# Patient Record
Sex: Female | Born: 1955
Health system: Southern US, Community
[De-identification: ages and names within clinical notes are randomized; demographics above are authoritative.]

## PROBLEM LIST (undated history)

## (undated) DIAGNOSIS — E785 Hyperlipidemia, unspecified: Secondary | ICD-10-CM

## (undated) HISTORY — DX: Hyperlipidemia, unspecified: E78.5

---
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
## 1999-03-06 ENCOUNTER — Ambulatory Visit (HOSPITAL_COMMUNITY): Admission: RE | Admit: 1999-03-06 | Discharge: 1999-03-06 | Payer: Self-pay | Admitting: Obstetrics and Gynecology

## 1999-03-24 HISTORY — PX: TUBAL LIGATION: SHX77

## 2009-03-23 HISTORY — PX: HYSTEROTOMY: SHX1776

## 2010-02-18 ENCOUNTER — Encounter: Admission: RE | Admit: 2010-02-18 | Discharge: 2010-02-18 | Payer: Self-pay | Admitting: Gynecology

## 2010-02-27 ENCOUNTER — Ambulatory Visit
Admission: RE | Admit: 2010-02-27 | Discharge: 2010-02-27 | Payer: Self-pay | Source: Home / Self Care | Attending: Gynecology | Admitting: Gynecology

## 2010-04-14 LAB — HEMOGLOBIN A1C: Hgb A1c MFr Bld: 8.3 % — AB (ref 4.0–6.0)

## 2010-06-03 LAB — POCT I-STAT 4, (NA,K, GLUC, HGB,HCT)
Glucose, Bld: 161 mg/dL — ABNORMAL HIGH (ref 70–99)
HCT: 41 % (ref 36.0–46.0)
Hemoglobin: 13.9 g/dL (ref 12.0–15.0)
Potassium: 3.7 mEq/L (ref 3.5–5.1)
Sodium: 141 mEq/L (ref 135–145)

## 2010-08-08 NOTE — Op Note (Signed)
[facility]  Patient:    Laura Boone                      MRN: [id number] Proc. Date: 03/06/99 Adm. Date:  [id number] Attending:  Lendon Colonel                           Operative Report  PREOPERATIVE DIAGNOSIS:       Desires sterilization.  POSTOPERATIVE DIAGNOSIS:      Desires sterilization.  OPERATION:                    Laparoscopic tubal ligation.  SURGEON:                      Katherine Roan, M.D.  ANESTHESIA:  DESCRIPTION OF PROCEDURE:     The patient was placed in the lithotomy position,  prepped and draped in the usual fashion.  A transverse incision was made at the  umbilicus and the abdomen was distended with carbon dioxide using an aspiration  infusion technique.  A Hulka elevator was inserted into an anterior pointing uterus.  Visualization of the pelvis was accomplished through the umbilicus with a reuseable 10 trocar.  The scope was inserted and the ovaries and tubes were completely normal.  There was small sealing fibroids on the top of the uterus.  there was no evidence of endometriosis.  The tubes were then cauterized 2 cm lateral to the uterotubal junction ________.  There was some oozing underneath he left mesosalpinx and this was cauterized for hemostasis.  Wide luminal separation occurred.  Both ovaries were normal.  Gas was evacuated.  The skin was closed with Dexon.  The incisions were then infiltrated with 0.5% Marcaine with epinephrine. The second incision was a 5 mm incision that was performed under direct vision nce the laparoscope was in the abdomen. DD:  03/06/99 TD:  03/07/99 Job: [id number]

## 2010-08-21 ENCOUNTER — Encounter: Payer: 59 | Attending: Endocrinology | Admitting: *Deleted

## 2010-08-21 DIAGNOSIS — Z713 Dietary counseling and surveillance: Secondary | ICD-10-CM | POA: Insufficient documentation

## 2010-08-21 DIAGNOSIS — E669 Obesity, unspecified: Secondary | ICD-10-CM | POA: Insufficient documentation

## 2010-08-21 DIAGNOSIS — E119 Type 2 diabetes mellitus without complications: Secondary | ICD-10-CM | POA: Insufficient documentation

## 2010-09-30 ENCOUNTER — Encounter: Payer: Self-pay | Admitting: *Deleted

## 2010-09-30 ENCOUNTER — Ambulatory Visit: Payer: 59 | Admitting: *Deleted

## 2010-09-30 DIAGNOSIS — E119 Type 2 diabetes mellitus without complications: Secondary | ICD-10-CM

## 2011-02-11 ENCOUNTER — Other Ambulatory Visit: Payer: Self-pay | Admitting: Gynecology

## 2011-02-11 DIAGNOSIS — Z1231 Encounter for screening mammogram for malignant neoplasm of breast: Secondary | ICD-10-CM

## 2011-03-02 ENCOUNTER — Other Ambulatory Visit: Payer: Self-pay | Admitting: Gynecology

## 2011-03-04 ENCOUNTER — Ambulatory Visit
Admission: RE | Admit: 2011-03-04 | Discharge: 2011-03-04 | Disposition: A | Discharge status: Home / Self Care | Payer: 59 | Source: Ambulatory Visit | Attending: Gynecology | Admitting: Gynecology

## 2011-03-04 DIAGNOSIS — Z1231 Encounter for screening mammogram for malignant neoplasm of breast: Secondary | ICD-10-CM

## 2012-04-15 ENCOUNTER — Other Ambulatory Visit: Payer: Self-pay | Admitting: Gynecology

## 2012-04-15 DIAGNOSIS — Z1231 Encounter for screening mammogram for malignant neoplasm of breast: Secondary | ICD-10-CM

## 2012-05-02 ENCOUNTER — Ambulatory Visit
Admission: RE | Admit: 2012-05-02 | Discharge: 2012-05-02 | Disposition: A | Discharge status: Home / Self Care | Payer: 59 | Source: Ambulatory Visit | Attending: Gynecology | Admitting: Gynecology

## 2012-05-02 DIAGNOSIS — Z1231 Encounter for screening mammogram for malignant neoplasm of breast: Secondary | ICD-10-CM

## 2013-06-19 ENCOUNTER — Telehealth: Payer: Self-pay | Admitting: Family Medicine

## 2013-06-19 NOTE — Telephone Encounter (Signed)
Pt called and I told her Dr. Milinda Antisower advised she be treated today for the shingles.

## 2013-06-19 NOTE — Telephone Encounter (Signed)
I cannot unfortunately since I have so many people in line to get in- she does need to get her into and urgent care today (it cannot wait) for shingles as this should be seen immediately

## 2013-06-19 NOTE — Telephone Encounter (Signed)
Pt called and says her daughter, Laura Boone is your pt.  Nettie ElmSylvia would like to est w/you as her PCP, but since you're not taking new patients over 40, I told her you weren't accepting new patients.  She wants to know if you will make an exception since her daughter is your patient.  Nettie ElmSylvia also wants to be seen as quickly as possible b/c she thinks she may have shingles.  Can you make an exception and accommodate a new pt apptmt for her? Thank you.

## 2013-11-29 ENCOUNTER — Other Ambulatory Visit: Payer: Self-pay

## 2013-11-29 DIAGNOSIS — Z1231 Encounter for screening mammogram for malignant neoplasm of breast: Secondary | ICD-10-CM

## 2013-12-12 ENCOUNTER — Ambulatory Visit: Payer: 59

## 2013-12-18 ENCOUNTER — Ambulatory Visit
Admission: RE | Admit: 2013-12-18 | Discharge: 2013-12-18 | Disposition: A | Discharge status: Home / Self Care | Payer: 59 | Source: Ambulatory Visit

## 2013-12-18 DIAGNOSIS — Z1231 Encounter for screening mammogram for malignant neoplasm of breast: Secondary | ICD-10-CM

## 2015-06-24 ENCOUNTER — Other Ambulatory Visit: Payer: Self-pay

## 2015-06-24 DIAGNOSIS — Z1231 Encounter for screening mammogram for malignant neoplasm of breast: Secondary | ICD-10-CM

## 2015-07-10 ENCOUNTER — Ambulatory Visit
Admission: RE | Admit: 2015-07-10 | Discharge: 2015-07-10 | Disposition: A | Discharge status: Home / Self Care | Payer: 59 | Source: Ambulatory Visit

## 2015-07-10 DIAGNOSIS — Z1231 Encounter for screening mammogram for malignant neoplasm of breast: Secondary | ICD-10-CM

## 2016-05-21 HISTORY — PX: MOHS SURGERY: SUR867

## 2016-08-28 ENCOUNTER — Other Ambulatory Visit: Payer: Self-pay | Admitting: Gynecology

## 2016-08-28 DIAGNOSIS — Z1231 Encounter for screening mammogram for malignant neoplasm of breast: Secondary | ICD-10-CM

## 2016-08-31 ENCOUNTER — Ambulatory Visit
Admission: RE | Admit: 2016-08-31 | Discharge: 2016-08-31 | Disposition: A | Discharge status: Home / Self Care | Payer: 59 | Source: Ambulatory Visit | Attending: Gynecology | Admitting: Gynecology

## 2016-08-31 DIAGNOSIS — Z1231 Encounter for screening mammogram for malignant neoplasm of breast: Secondary | ICD-10-CM

## 2017-01-28 ENCOUNTER — Ambulatory Visit: Payer: 59 | Admitting: Family Medicine

## 2017-01-28 ENCOUNTER — Encounter: Payer: Self-pay | Admitting: Family Medicine

## 2017-01-28 DIAGNOSIS — Z87891 Personal history of nicotine dependence: Secondary | ICD-10-CM | POA: Insufficient documentation

## 2017-01-28 DIAGNOSIS — B009 Herpesviral infection, unspecified: Secondary | ICD-10-CM | POA: Insufficient documentation

## 2017-01-28 DIAGNOSIS — Z5320 Procedure and treatment not carried out because of patient's decision for unspecified reasons: Secondary | ICD-10-CM | POA: Insufficient documentation

## 2017-01-28 DIAGNOSIS — Z808 Family history of malignant neoplasm of other organs or systems: Secondary | ICD-10-CM | POA: Insufficient documentation

## 2017-01-28 DIAGNOSIS — M5412 Radiculopathy, cervical region: Secondary | ICD-10-CM

## 2017-01-28 DIAGNOSIS — R102 Pelvic and perineal pain: Secondary | ICD-10-CM | POA: Insufficient documentation

## 2017-01-28 DIAGNOSIS — E1169 Type 2 diabetes mellitus with other specified complication: Secondary | ICD-10-CM

## 2017-01-28 DIAGNOSIS — Z780 Asymptomatic menopausal state: Secondary | ICD-10-CM | POA: Insufficient documentation

## 2017-01-28 DIAGNOSIS — M545 Low back pain, unspecified: Secondary | ICD-10-CM | POA: Insufficient documentation

## 2017-01-28 DIAGNOSIS — E118 Type 2 diabetes mellitus with unspecified complications: Secondary | ICD-10-CM

## 2017-01-28 DIAGNOSIS — E1140 Type 2 diabetes mellitus with diabetic neuropathy, unspecified: Secondary | ICD-10-CM | POA: Insufficient documentation

## 2017-01-28 DIAGNOSIS — E785 Hyperlipidemia, unspecified: Secondary | ICD-10-CM

## 2017-01-28 DIAGNOSIS — F419 Anxiety disorder, unspecified: Secondary | ICD-10-CM | POA: Insufficient documentation

## 2017-01-28 NOTE — Patient Instructions (Addendum)
Please get me the lab work from Dr. Willeen CassBalan's office-since she recently had.  Please printed off of your my chart from them for all labs done within the past year.  Please try to drop this off within the next month.  Then if we need additional lab work we will contact you otherwise we will see you in 6 months.  If I do not get it within the next time I see you, please prepared to give us blood   -I will need to see you twice a year for chronic disease management of your diabetes, hyperlipidemia, weight management etc. and then 1 additional time per year for your yearly physical to ensure were doing your appropriate screening tests and immunizations etc.    Please realize, EXERCISE IS MEDICINE!  -  American Heart Association [facility]( AHA) guidelines for exercise : If you are in good health, without any medical conditions, you should engage in 150 minutes of moderate intensity aerobic activity per week.  This means you should be huffing and puffing throughout your workout.   Engaging in regular exercise will improve brain function and memory, as well as improve mood, boost immune system and help with weight management.  As well as the other, more well-known effects of exercise such as decreasing blood sugar levels, decreasing blood pressure,  and decreasing bad cholesterol levels/ increasing good cholesterol levels.     -  The AHA strongly endorses consumption of a diet that contains a variety of foods from all the food categories with an emphasis on fruits and vegetables; fat-free and low-fat dairy products; cereal and grain products; legumes and nuts; and fish, poultry, and/or extra lean meats.    Excessive food intake, especially of foods high in saturated and trans fats, sugar, and salt, should be avoided.    Adequate water intake of roughly 1/2 of your weight in pounds, should equal the ounces of water per day you should drink.  So for instance, if you're 200 pounds, that would be 100 ounces of water per  day.         Mediterranean Diet  Why follow it? Research shows. . Those who follow the Mediterranean diet have a reduced risk of heart disease  . The diet is associated with a reduced incidence of Parkinson's and Alzheimer's diseases . People following the diet may have longer life expectancies and lower rates of chronic diseases  . The Dietary Guidelines for Americans recommends the Mediterranean diet as an eating plan to promote health and prevent disease  What Is the Mediterranean Diet?  . Healthy eating plan based on typical foods and recipes of Mediterranean-style cooking . The diet is primarily a plant based diet; these foods should make up a majority of meals   Starches - Plant based foods should make up a majority of meals - They are an important sources of vitamins, minerals, energy, antioxidants, and fiber - Choose whole grains, foods high in fiber and minimally processed items  - Typical grain sources include wheat, oats, barley, corn, brown rice, bulgar, farro, millet, polenta, couscous  - Various types of beans include chickpeas, lentils, fava beans, black beans, white beans   Fruits  Veggies - Large quantities of antioxidant rich fruits & veggies; 6 or more servings  - Vegetables can be eaten raw or lightly drizzled with oil and cooked  - Vegetables common to the traditional Mediterranean Diet include: artichokes, arugula, beets, broccoli, brussel sprouts, cabbage, carrots, celery, collard greens, cucumbers, eggplant, kale, leeks, lemons,  lettuce, mushrooms, okra, onions, peas, peppers, potatoes, pumpkin, radishes, rutabaga, shallots, spinach, sweet potatoes, turnips, zucchini - Fruits common to the Mediterranean Diet include: apples, apricots, avocados, cherries, clementines, dates, figs, grapefruits, grapes, melons, nectarines, oranges, peaches, pears, pomegranates, strawberries, tangerines  Fats - Replace butter and margarine with healthy oils, such as olive oil, canola  oil, and tahini  - Limit nuts to no more than a handful a day  - Nuts include walnuts, almonds, pecans, pistachios, pine nuts  - Limit or avoid candied, honey roasted or heavily salted nuts - Olives are central to the [employer]iterranean diet - can be eaten whole or used in a variety of dishes   Meats Protein - Limiting red meat: no more than a few times a month - When eating red meat: choose lean cuts and keep the portion to the size of deck of cards - Eggs: approx. 0 to 4 times a week  - Fish and lean poultry: at least 2 a week  - Healthy protein sources include, chicken, [place]key, lean beef, lamb - Increase intake of seafood such as tuna, salmon, trout, mackerel, shrimp, scallops - Avoid or limit high fat processed meats such as sausage and bacon  Dairy - Include moderate amounts of low fat dairy products  - Focus on healthy dairy such as fat free yogurt, skim milk, low or reduced fat cheese - Limit dairy products higher in fat such as whole or 2% milk, cheese, ice cream  Alcohol - Moderate amounts of red wine is ok  - No more than 5 oz daily for women (all ages) and men older than age 59  - No more than 10 oz of wine daily for men younger than 6265  Other - Limit sweets and other desserts  - Use herbs and spices instead of salt to flavor foods  - Herbs and spices common to the traditional Mediterranean Diet include: basil, bay leaves, chives, cloves, cumin, fennel, garlic, lavender, marjoram, mint, oregano, parsley, pepper, rosemary, sage, savory, sumac, tarragon, thyme   It's not just a diet, it's a lifestyle:  . The Mediterranean diet includes lifestyle factors typical of those in the region  . Foods, drinks and meals are best eaten with others and savored . Daily physical activity is important for overall good health . This could be strenuous exercise like running and aerobics . This could also be more leisurely activities such as walking, housework, yard-work, or taking the  stairs . Moderation is the key; a balanced and healthy diet accommodates most foods and drinks . Consider portion sizes and frequency of consumption of certain foods   Meal Ideas & Options:  . Breakfast:  o Whole wheat toast or whole wheat English muffins with peanut butter & hard boiled egg o Steel cut oats topped with apples & cinnamon and skim milk  o Fresh fruit: banana, strawberries, melon, berries, peaches  o Smoothies: strawberries, bananas, greek yogurt, peanut butter o Low fat greek yogurt with blueberries and granola  o Egg white omelet with spinach and mushrooms o Breakfast couscous: whole wheat couscous, apricots, skim milk, cranberries  . Sandwiches:  o Hummus and grilled vegetables (peppers, zucchini, squash) on whole wheat bread   o Grilled chicken on whole wheat pita with lettuce, tomatoes, cucumbers or tzatziki  o Tuna salad on whole wheat bread: tuna salad made with greek yogurt, olives, red peppers, capers, green onions o Garlic rosemary lamb pita: lamb sauted with garlic, rosemary, salt & pepper; add lettuce, cucumber, greek yogurt  to pita - flavor with lemon juice and black pepper  . Seafood:  o Mediterranean grilled salmon, seasoned with garlic, basil, parsley, lemon juice and black pepper o Shrimp, lemon, and spinach whole-grain pasta salad made with low fat greek yogurt  o Seared scallops with lemon orzo  o Seared tuna steaks seasoned salt, pepper, coriander topped with tomato mixture of olives, tomatoes, olive oil, minced garlic, parsley, green onions and cappers  . Meats:  o Herbed greek chicken salad with kalamata olives, cucumber, feta  o Red bell peppers stuffed with spinach, bulgur, lean ground beef (or lentils) & topped with feta   o Kebabs: skewers of chicken, tomatoes, onions, zucchini, squash  o [place] burgers: made with red onions, mint, dill, lemon juice, feta cheese topped with roasted red peppers . Vegetarian o Cucumber salad: cucumbers, artichoke  hearts, celery, red onion, feta cheese, tossed in olive oil & lemon juice  o Hummus and whole grain pita points with a greek salad (lettuce, tomato, feta, olives, cucumbers, red onion) o Lentil soup with celery, carrots made with vegetable broth, garlic, salt and pepper  o Tabouli salad: parsley, bulgur, mint, scallions, cucumbers, tomato, radishes, lemon juice, olive oil, salt and pepper.

## 2017-01-28 NOTE — Progress Notes (Signed)
New patient office visit note:  Impression and Recommendations:    1. Hyperlipidemia associated with type 2 diabetes mellitus (HCC)   2. Cervical radiculopathy w h/o of diabetes mellitus (HCC)   3. History of smoking 30 or more pack years- quit 2010   4. Type 2 diabetes mellitus with complication, without long-term current use of insulin (HCC)   5. Colonoscopy refused   6. Family history of bone cancer- father age 59   7. Family history of brain cancer- mom died 2004 age 460's     There are no diagnoses linked to this encounter.  There are no diagnoses linked to this encounter.  No problem-specific Assessment & Plan notes found for this encounter.    Education and routine counseling performed. Handouts provided.  No orders of the defined types were placed in this encounter.   Gross side effects, risk and benefits, and alternatives of medications discussed with patient.  Patient is aware that all medications have potential side effects and we are unable to predict every side effect or drug-drug interaction that may occur.  Expresses verbal understanding and consents to current therapy plan and treatment regimen.  No Follow-up on file.  Please see AVS handed out to patient at the end of our visit for further patient instructions/ counseling done pertaining to today's office visit.    Note: This document was prepared using Dragon voice recognition software and may include unintentional dictation errors.  ----------------------------------------------------------------------------------------------------------------------    Subjective:    Chief complaint:   Chief Complaint  Patient presents with  . Establish Care     HPI: Laura Boone is a pleasant 61 y.o. female who [facility] to review their medical history with me and establish care.   I asked the patient to review their chronic problem list with me to ensure  everything was updated and accurate.    All recent office visits with other providers, any medical records that patient brought in etc  - I reviewed today.     Also asked pt to get me medical records from [facility] providers/ specialists that they had seen within the past 3-5 years- if they are in private practice and/or do not work for a [facility], [facility], [facility], [facility] or [employer] owned practice.  Told them to call their specialists to clarify this if they are not sure.   Never had a PCP.  Using GYN as PCP- referred to Laura Boone for Pre-DM.  daughter is a Secondary school teacherA-C at [employer], single-no sniffing and other, has worked at [employer] for 39 years currently as a Tax advisersales rep at [employer] is aware.  Patient has declined colonoscopy in the past.  Patient admits she is not the best at going to the doctors and wanting to get everything done that needs to be done.    Wt Readings from Last 3 Encounters:  01/28/17 207 lb (93.9 kg)  09/08/10 224 lb 1.6 oz (101.7 kg)   BP Readings from Last 3 Encounters:  01/28/17 106/72   Pulse Readings from Last 3 Encounters:  01/28/17 86   BMI Readings from Last 3 Encounters:  01/28/17 36.67 kg/m  09/08/10 38.47 kg/m    Patient Care Team    Relationship Specialty Notifications Start End  Laura Boone, Laura Onofrio, DO PCP - General Family Medicine  01/28/17   Laura FramesBalan, Bindubal, MD Consulting Physician Endocrinology  01/28/17   Boone, Laura SchneidersEvelyn M, NP Nurse Practitioner Gynecology  01/28/17  Boone, Laura Carol, MD Referring PhyElesa Hackersician Dermatology  01/28/17   Laura GantKendall, Laura S, MD Attending Physician Family Medicine  01/28/17    Comment: [facility] - R shoulder pain    Patient Active Problem List   Diagnosis Date Noted  . Hyperlipidemia associated with type 2 diabetes mellitus (HCC) 01/28/2017    Priority: High  . Type 2 diabetes mellitus (HCC)     Priority: High  . History of smoking 30 or more pack years- quit 2010 01/28/2017    Priority: Medium  . Acute low back pain  01/28/2017  . Anxiety 01/28/2017  . Herpes simplex 01/28/2017  . Menopause present 01/28/2017  . Pain in pelvis 01/28/2017  . Cervical radiculopathy w h/o of diabetes mellitus (HCC) 01/28/2017  . Colonoscopy refused 01/28/2017  . Family history of bone cancer- father age 59- 2009 01/28/2017  . Family history of brain cancer- mom died 2004 age 59's 01/28/2017     Past Medical History:  Diagnosis Date  . Diabetes mellitus   . Hyperlipidemia      Past Medical History:  Diagnosis Date  . Diabetes mellitus   . Hyperlipidemia      Past Surgical History:  Procedure Laterality Date  . HYSTEROTOMY  2011  . MOHS SURGERY  05/2016  . TONSILLECTOMY  1965  . TUBAL LIGATION  2001     Family History  Problem Relation Age of Onset  . Breast cancer Maternal Aunt 53  . Brain cancer Mother   . Hyperlipidemia Father   . Bone cancer Father      Social History   Substance and Sexual Activity  Drug Use No     Social History   Substance and Sexual Activity  Alcohol Use Yes   Comment: occ.     Social History   Tobacco Use  Smoking Status Former Smoker  . Types: Cigarettes  . Last attempt to quit: 08/30/2008  . Years since quitting: 8.4  Smokeless Tobacco Never Used     Outpatient Encounter Medications as of 01/28/2017  Medication Sig Note  . gabapentin (NEURONTIN) 300 MG capsule Take 300 mg 2 (two) times daily by mouth. Patient takes 1-2 daily as needed   . INVOKAMET XR 50-1000 MG TB24 Take 1 tablet daily by mouth.   . rosuvastatin (CRESTOR) 20 MG tablet Take 1 tablet daily by mouth.   Marland Kitchen. HYDROcodone-acetaminophen (NORCO) 7.5-325 MG tablet TAKE 1 TABLET BY MOUTH EVERY 6-8 HOURS AS NEEDED FOR PAIN 01/28/2017: Patient was taking for neck pain - has not taken in 3 weeks   . methocarbamol (ROBAXIN) 750 MG tablet TAKE 1 TABLET BY MOUTH THREE TIMES A DAY AS NEEDED FOR MUSCLE SPASM 01/28/2017: Patient was taking for neck pain - has not taken in 2-3 weeks  . predniSONE  (STERAPRED UNI-PAK 21 TAB) 10 MG (21) TBPK tablet  01/28/2017: Patient was taking for neck pain finished 1.5 weeks ago   No facility-administered encounter medications on file as of 01/28/2017.     Allergies: Patient has no active allergies.   ROS   Objective:   Blood pressure 106/72, pulse 86, height 5\' 3"  (1.6 m), weight 207 lb (93.9 kg). Body mass index is 36.67 kg/m. General: Well Developed, well nourished, and in no acute distress.  Neuro: Alert and oriented x3, extra-ocular muscles intact, sensation grossly intact.  HEENT:[place]/AT, PERRLA, neck supple, No carotid bruits Skin: no gross rashes  Cardiac: Regular rate and rhythm Respiratory: Essentially clear to auscultation bilaterally. Not using accessory muscles, speaking in  full sentences.  Abdominal: not grossly distended Musculoskeletal: Ambulates w/o diff, FROM * 4 ext.  Vasc: less 2 sec cap RF, warm and pink  Psych:  No HI/SI, judgement and insight good, Euthymic mood. Full Affect.    No results found for this or any previous visit (from the past 2160 hour(s)).

## 2017-02-09 ENCOUNTER — Ambulatory Visit (INDEPENDENT_AMBULATORY_CARE_PROVIDER_SITE_OTHER): Payer: 59 | Admitting: Sports Medicine

## 2017-02-09 ENCOUNTER — Encounter: Payer: Self-pay | Admitting: Sports Medicine

## 2017-02-09 VITALS — BP 100/64 | HR 93 | Resp 16

## 2017-02-09 DIAGNOSIS — L608 Other nail disorders: Secondary | ICD-10-CM

## 2017-02-09 DIAGNOSIS — L603 Nail dystrophy: Secondary | ICD-10-CM

## 2017-02-09 NOTE — Progress Notes (Signed)
Subjective: Laura SprinklesSylvia Boone is a 61 y.o. female patient seen today in office with complaint of sometimes painful thickened and discolored nails especially left 1st toenail. Patient is desiring treatment for nail changes; has tried self trimming with no improvement. Reports that nails are becoming difficult to manage because of the thickness and does not like how the left 1st toenail grows in on the sides; Admits previous PNA and Lamisil PO in the past. Patient has no other pedal complaints at this time.   Patient Active Problem List   Diagnosis Date Noted  . Acute low back pain 01/28/2017  . Anxiety 01/28/2017  . Herpes simplex 01/28/2017  . Menopause present 01/28/2017  . Pain in pelvis 01/28/2017  . Hyperlipidemia associated with type 2 diabetes mellitus (HCC) 01/28/2017  . Cervical radiculopathy w h/o of diabetes mellitus (HCC) 01/28/2017  . History of smoking 30 or more pack years- quit 2010 01/28/2017  . Colonoscopy refused 01/28/2017  . Family history of bone cancer- father age 59- 2009 01/28/2017  . Family history of brain cancer- mom died 2004 age 59's 01/28/2017  . Type 2 diabetes mellitus (HCC)     Current Outpatient Medications on File Prior to Visit  Medication Sig Dispense Refill  . gabapentin (NEURONTIN) 300 MG capsule Take 300 mg daily by mouth. Patient takes 1-2 daily as needed  2  . HYDROcodone-acetaminophen (NORCO) 7.5-325 MG tablet TAKE 1 TABLET BY MOUTH EVERY 6-8 HOURS AS NEEDED FOR PAIN  0  . INVOKAMET XR 50-1000 MG TB24 Take 1 tablet daily by mouth.    . methocarbamol (ROBAXIN) 750 MG tablet TAKE 1 TABLET BY MOUTH THREE TIMES A DAY AS NEEDED FOR MUSCLE SPASM  0  . predniSONE (STERAPRED UNI-PAK 21 TAB) 10 MG (21) TBPK tablet     . rosuvastatin (CRESTOR) 20 MG tablet Take 1 tablet daily by mouth.     No current facility-administered medications on file prior to visit.     No Known Allergies  Objective: Physical Exam  General: Well developed, nourished, no acute  distress, awake, alert and oriented x 3  Vascular: Dorsalis pedis artery 2/4 bilateral, Posterior tibial artery 1/4 bilateral, skin temperature warm to warm proximal to distal bilateral lower extremities, no varicosities, pedal hair present bilateral.  Neurological: Gross sensation present via light touch bilateral.   Dermatological: Skin is warm, dry, and supple bilateral, Nails 1-10 are tender, short thick, and discolored with mild subungal debris with L>R 1st toenail most involved, no webspace macerations present bilateral, no open lesions present bilateral, no callus/corns/hyperkeratotic tissue present bilateral. No signs of infection bilateral.  Musculoskeletal: No symptomatic boney deformities noted bilateral. Muscular strength within normal limits without painon range of motion. No pain with calf compression bilateral.  Assessment and Plan:  Problem List Items Addressed This Visit    None    Visit Diagnoses    Nail dystrophy    -  Primary   Pincer nail deformity          -Examined patient -Discussed treatment options for painful dystrophic nails  -Complimentarily trimmed nails for patient using sterile nail nipper without incident, Since patient does not want nail procedure recommend topical tea tree oil and filing nail -If nails show no improvement then recommend fungal culture and laser  -Patient to return in 10 weeks for follow up evaluation and discussion of nails or sooner if symptoms worsen.  Asencion Islamitorya Anasia Agro, DPM

## 2017-02-09 NOTE — Patient Instructions (Addendum)
Tea tree oil to nails daily after shower and filing them down

## 2017-02-09 NOTE — Progress Notes (Signed)
   Subjective:    Patient ID: Laura SprinklesSylvia Boone, female    DOB: November 10, 1955, 61 y.o.   MRN: [id number]  HPI    Review of Systems  All other systems reviewed and are negative.      Objective:   Physical Exam        Assessment & Plan:

## 2017-03-22 ENCOUNTER — Other Ambulatory Visit: Payer: Self-pay | Admitting: Endocrinology

## 2017-03-22 DIAGNOSIS — R9389 Abnormal findings on diagnostic imaging of other specified body structures: Secondary | ICD-10-CM

## 2017-03-27 DIAGNOSIS — M898X1 Other specified disorders of bone, shoulder: Secondary | ICD-10-CM | POA: Insufficient documentation

## 2017-03-27 DIAGNOSIS — M79601 Pain in right arm: Secondary | ICD-10-CM | POA: Insufficient documentation

## 2017-03-29 ENCOUNTER — Ambulatory Visit
Admission: RE | Admit: 2017-03-29 | Discharge: 2017-03-29 | Disposition: A | Discharge status: Home / Self Care | Payer: 59 | Source: Ambulatory Visit | Attending: Endocrinology | Admitting: Endocrinology

## 2017-03-29 DIAGNOSIS — R9389 Abnormal findings on diagnostic imaging of other specified body structures: Secondary | ICD-10-CM

## 2017-03-30 ENCOUNTER — Other Ambulatory Visit: Payer: Self-pay | Admitting: Endocrinology

## 2017-03-30 DIAGNOSIS — E049 Nontoxic goiter, unspecified: Secondary | ICD-10-CM

## 2017-04-20 ENCOUNTER — Ambulatory Visit (INDEPENDENT_AMBULATORY_CARE_PROVIDER_SITE_OTHER): Payer: 59 | Admitting: Sports Medicine

## 2017-04-20 ENCOUNTER — Encounter: Payer: Self-pay | Admitting: Sports Medicine

## 2017-04-20 DIAGNOSIS — M79674 Pain in right toe(s): Secondary | ICD-10-CM | POA: Diagnosis not present

## 2017-04-20 DIAGNOSIS — L608 Other nail disorders: Secondary | ICD-10-CM

## 2017-04-20 DIAGNOSIS — L603 Nail dystrophy: Secondary | ICD-10-CM

## 2017-04-20 DIAGNOSIS — M79675 Pain in left toe(s): Secondary | ICD-10-CM | POA: Diagnosis not present

## 2017-04-20 NOTE — Progress Notes (Signed)
Subjective: Lamar SprinklesSylvia Boone is a 62 y.o. female patient seen today in office for nail check. Reports that she has been doing the tea tree oil with some improvement and hasn't had any issues with pain or ingrown yet on left. Patient is also here to discuss fungal culture vs laser for nails. Denies nausea/vomiting/fever/chills/swelling/pain acutely in toes. Patient has no other pedal complaints at this time.   Patient Active Problem List   Diagnosis Date Noted  . Acute low back pain 01/28/2017  . Anxiety 01/28/2017  . Herpes simplex 01/28/2017  . Menopause present 01/28/2017  . Pain in pelvis 01/28/2017  . Hyperlipidemia associated with type 2 diabetes mellitus (HCC) 01/28/2017  . Cervical radiculopathy w h/o of diabetes mellitus (HCC) 01/28/2017  . History of smoking 30 or more pack years- quit 2010 01/28/2017  . Colonoscopy refused 01/28/2017  . Family history of bone cancer- father age 170- 2009 01/28/2017  . Family history of brain cancer- mom died 2004 age 60's 01/28/2017  . Type 2 diabetes mellitus (HCC)     Current Outpatient Medications on File Prior to Visit  Medication Sig Dispense Refill  . glimepiride (AMARYL) 1 MG tablet TAKE 1 TABLET WITH BREAKFAST OR THE FIRST MAIN MEAL OF THE DAY    . INVOKAMET XR 50-1000 MG TB24 Take 1 tablet daily by mouth.    . metroNIDAZOLE (METROGEL) 1 % gel metronidazole 1 % topical gel    . rosuvastatin (CRESTOR) 20 MG tablet Take 1 tablet daily by mouth.     No current facility-administered medications on file prior to visit.     No Known Allergies  Objective: Physical Exam  General: Well developed, nourished, no acute distress, awake, alert and oriented x 3  Vascular: Dorsalis pedis artery 2/4 bilateral, Posterior tibial artery 1/4 bilateral, skin temperature warm to warm proximal to distal bilateral lower extremities, no varicosities, pedal hair present bilateral.  Neurological: Gross sensation present via light touch bilateral.    Dermatological: Skin is warm, dry, and supple bilateral, Nails 1-10 are tender, short thick, and discolored with mild subungal debris with L>R 1st toenail most involved with pince deformity, no webspace macerations present bilateral, no open lesions present bilateral, no callus/corns/hyperkeratotic tissue present bilateral. No signs of infection bilateral.  Musculoskeletal: No symptomatic boney deformities noted bilateral. Muscular strength within normal limits without painon range of motion. No pain with calf compression bilateral.  Assessment and Plan:  Problem List Items Addressed This Visit    None    Visit Diagnoses    Nail dystrophy    -  Primary   Pincer nail deformity       Toe pain, bilateral       L>R      -Examined patient -Re-Discussed treatment options for painful dystrophic nails  -Complimentarily trimmed nails for patient using sterile nail nipper without incident -Since patient does not want nail procedure recommend continue with topical tea tree oil and filing nail -We will continue to monitor nails and consider adding on laser or doing a fungal culture if nails fail to continue to improve -Patient to return in 10 weeks for follow up evaluation or sooner if symptoms worsen.  Asencion Islamitorya Bret Stamour, DPM

## 2017-05-11 DIAGNOSIS — J3489 Other specified disorders of nose and nasal sinuses: Secondary | ICD-10-CM | POA: Insufficient documentation

## 2017-05-11 DIAGNOSIS — J342 Deviated nasal septum: Secondary | ICD-10-CM | POA: Insufficient documentation

## 2017-07-06 ENCOUNTER — Ambulatory Visit: Payer: 59 | Admitting: Sports Medicine

## 2017-07-15 DIAGNOSIS — M5020 Other cervical disc displacement, unspecified cervical region: Secondary | ICD-10-CM | POA: Insufficient documentation

## 2017-07-29 ENCOUNTER — Encounter: Payer: 59 | Admitting: Family Medicine

## 2017-08-05 ENCOUNTER — Encounter: Payer: 59 | Admitting: Family Medicine

## 2017-08-05 ENCOUNTER — Ambulatory Visit: Payer: 59 | Admitting: Family Medicine

## 2017-08-11 DIAGNOSIS — M7541 Impingement syndrome of right shoulder: Secondary | ICD-10-CM | POA: Insufficient documentation

## 2017-09-26 DIAGNOSIS — N951 Menopausal and female climacteric states: Secondary | ICD-10-CM | POA: Insufficient documentation

## 2017-09-26 DIAGNOSIS — E1165 Type 2 diabetes mellitus with hyperglycemia: Secondary | ICD-10-CM | POA: Insufficient documentation

## 2018-01-04 ENCOUNTER — Other Ambulatory Visit: Payer: Self-pay | Admitting: Gynecology

## 2018-01-04 DIAGNOSIS — Z1231 Encounter for screening mammogram for malignant neoplasm of breast: Secondary | ICD-10-CM

## 2018-01-13 ENCOUNTER — Ambulatory Visit
Admission: RE | Admit: 2018-01-13 | Discharge: 2018-01-13 | Disposition: A | Discharge status: Home / Self Care | Payer: 59 | Source: Ambulatory Visit | Attending: Gynecology | Admitting: Gynecology

## 2018-01-13 DIAGNOSIS — Z1231 Encounter for screening mammogram for malignant neoplasm of breast: Secondary | ICD-10-CM

## 2018-03-24 DIAGNOSIS — M25511 Pain in right shoulder: Secondary | ICD-10-CM | POA: Diagnosis not present

## 2018-03-28 ENCOUNTER — Other Ambulatory Visit: Payer: Self-pay | Admitting: Endocrinology

## 2018-03-28 DIAGNOSIS — E041 Nontoxic single thyroid nodule: Secondary | ICD-10-CM

## 2018-03-29 DIAGNOSIS — M25511 Pain in right shoulder: Secondary | ICD-10-CM | POA: Diagnosis not present

## 2018-03-31 DIAGNOSIS — M79601 Pain in right arm: Secondary | ICD-10-CM | POA: Diagnosis not present

## 2018-04-07 DIAGNOSIS — M25511 Pain in right shoulder: Secondary | ICD-10-CM | POA: Diagnosis not present

## 2018-04-08 ENCOUNTER — Ambulatory Visit
Admission: RE | Admit: 2018-04-08 | Discharge: 2018-04-08 | Disposition: A | Discharge status: Home / Self Care | Payer: BLUE CROSS/BLUE SHIELD | Source: Ambulatory Visit | Attending: Endocrinology | Admitting: Endocrinology

## 2018-04-08 DIAGNOSIS — E041 Nontoxic single thyroid nodule: Secondary | ICD-10-CM

## 2018-04-08 DIAGNOSIS — E042 Nontoxic multinodular goiter: Secondary | ICD-10-CM | POA: Diagnosis not present

## 2018-04-14 DIAGNOSIS — M25511 Pain in right shoulder: Secondary | ICD-10-CM | POA: Diagnosis not present

## 2018-04-19 DIAGNOSIS — M25511 Pain in right shoulder: Secondary | ICD-10-CM | POA: Diagnosis not present

## 2018-05-05 DIAGNOSIS — M25511 Pain in right shoulder: Secondary | ICD-10-CM | POA: Diagnosis not present

## 2018-08-10 DIAGNOSIS — Z5189 Encounter for other specified aftercare: Secondary | ICD-10-CM | POA: Diagnosis not present

## 2018-08-30 DIAGNOSIS — E7800 Pure hypercholesterolemia, unspecified: Secondary | ICD-10-CM | POA: Diagnosis not present

## 2018-08-30 DIAGNOSIS — E041 Nontoxic single thyroid nodule: Secondary | ICD-10-CM | POA: Diagnosis not present

## 2018-08-30 DIAGNOSIS — E1165 Type 2 diabetes mellitus with hyperglycemia: Secondary | ICD-10-CM | POA: Diagnosis not present

## 2018-09-06 DIAGNOSIS — E041 Nontoxic single thyroid nodule: Secondary | ICD-10-CM | POA: Diagnosis not present

## 2018-09-06 DIAGNOSIS — E1165 Type 2 diabetes mellitus with hyperglycemia: Secondary | ICD-10-CM | POA: Diagnosis not present

## 2018-09-06 DIAGNOSIS — E7800 Pure hypercholesterolemia, unspecified: Secondary | ICD-10-CM | POA: Diagnosis not present

## 2018-09-13 DIAGNOSIS — Z23 Encounter for immunization: Secondary | ICD-10-CM | POA: Diagnosis not present

## 2019-02-28 DIAGNOSIS — E7800 Pure hypercholesterolemia, unspecified: Secondary | ICD-10-CM | POA: Diagnosis not present

## 2019-02-28 DIAGNOSIS — E1165 Type 2 diabetes mellitus with hyperglycemia: Secondary | ICD-10-CM | POA: Diagnosis not present

## 2019-02-28 DIAGNOSIS — E041 Nontoxic single thyroid nodule: Secondary | ICD-10-CM | POA: Diagnosis not present

## 2019-03-07 DIAGNOSIS — E1165 Type 2 diabetes mellitus with hyperglycemia: Secondary | ICD-10-CM | POA: Diagnosis not present

## 2019-03-07 DIAGNOSIS — E7800 Pure hypercholesterolemia, unspecified: Secondary | ICD-10-CM | POA: Diagnosis not present

## 2019-03-07 DIAGNOSIS — E041 Nontoxic single thyroid nodule: Secondary | ICD-10-CM | POA: Diagnosis not present

## 2019-06-30 ENCOUNTER — Other Ambulatory Visit: Payer: Self-pay

## 2019-06-30 ENCOUNTER — Ambulatory Visit: Payer: 59 | Admitting: Podiatry

## 2019-06-30 DIAGNOSIS — L03032 Cellulitis of left toe: Secondary | ICD-10-CM | POA: Diagnosis not present

## 2019-06-30 NOTE — Patient Instructions (Signed)

## 2019-07-01 MED ORDER — DOXYCYCLINE HYCLATE 100 MG PO TABS: 100.0000 mg | ORAL_TABLET | Freq: Two times a day (BID) | ORAL | 0 refills | Status: AC

## 2019-07-03 ENCOUNTER — Telehealth: Payer: Self-pay | Admitting: Podiatry

## 2019-07-03 NOTE — Telephone Encounter (Signed)
Patient thought you were going to call in an different type of antibiotic for her, not the pill. Since it wasent at the Pharmacy on Friday, on call Doctor called her in a pill, and that's not what you guys had talked about. Patient is wondering if she should go ahead and take the pill.

## 2019-07-04 MED ORDER — NEOMYCIN-POLYMYXIN-HC 3.5-10000-1 OT SUSP: OTIC | 0 refills | Status: AC

## 2019-07-04 NOTE — Telephone Encounter (Signed)
Sent cortisporin drops

## 2019-07-04 NOTE — Addendum Note (Signed)
Addended by: Ventura Sellers on: 07/04/2019 08:41 AM   Modules accepted: Orders

## 2019-07-05 NOTE — Telephone Encounter (Signed)
Spoke to patient and advised.

## 2019-07-14 ENCOUNTER — Other Ambulatory Visit: Payer: Self-pay

## 2019-07-14 ENCOUNTER — Ambulatory Visit (INDEPENDENT_AMBULATORY_CARE_PROVIDER_SITE_OTHER): Payer: BC Managed Care – PPO | Admitting: Podiatry

## 2019-07-14 DIAGNOSIS — L600 Ingrowing nail: Secondary | ICD-10-CM

## 2019-07-14 DIAGNOSIS — M79676 Pain in unspecified toe(s): Secondary | ICD-10-CM

## 2019-07-14 NOTE — Progress Notes (Signed)
  Subjective:  Patient ID: Laura Boone, female    DOB: 07-03-1955,  MRN: [id number]  Chief Complaint  Patient presents with  . Nail Problem    Pt states healing well no concerns.    65 y.o. female presents for follow up of nail procedure. History confirmed with patient.   Objective:  Physical Exam: Ingrown nail avulsion site: overlying soft crust, no warmth, no drainage and no erythema Assessment:   1. Ingrown nail   2. Pain around toenail      Plan:  Patient was evaluated and treated and all questions answered.  S/p Ingrown Toenail Excision, left -Healing well without issue. -Discussed return precautions. -F/u PRN

## 2019-08-17 ENCOUNTER — Other Ambulatory Visit: Payer: Self-pay | Admitting: Gynecology

## 2019-08-17 DIAGNOSIS — Z1231 Encounter for screening mammogram for malignant neoplasm of breast: Secondary | ICD-10-CM

## 2019-08-31 DIAGNOSIS — E041 Nontoxic single thyroid nodule: Secondary | ICD-10-CM | POA: Diagnosis not present

## 2019-08-31 DIAGNOSIS — E7800 Pure hypercholesterolemia, unspecified: Secondary | ICD-10-CM | POA: Diagnosis not present

## 2019-08-31 DIAGNOSIS — E1165 Type 2 diabetes mellitus with hyperglycemia: Secondary | ICD-10-CM | POA: Diagnosis not present

## 2019-09-05 ENCOUNTER — Other Ambulatory Visit: Payer: Self-pay | Admitting: Endocrinology

## 2019-09-05 ENCOUNTER — Ambulatory Visit: Payer: BLUE CROSS/BLUE SHIELD

## 2019-09-05 DIAGNOSIS — E7800 Pure hypercholesterolemia, unspecified: Secondary | ICD-10-CM | POA: Diagnosis not present

## 2019-09-05 DIAGNOSIS — E1165 Type 2 diabetes mellitus with hyperglycemia: Secondary | ICD-10-CM | POA: Diagnosis not present

## 2019-09-05 DIAGNOSIS — E041 Nontoxic single thyroid nodule: Secondary | ICD-10-CM

## 2019-09-12 ENCOUNTER — Other Ambulatory Visit: Payer: Self-pay

## 2019-09-12 ENCOUNTER — Ambulatory Visit
Admission: RE | Admit: 2019-09-12 | Discharge: 2019-09-12 | Disposition: A | Discharge status: Home / Self Care | Payer: BLUE CROSS/BLUE SHIELD | Source: Ambulatory Visit | Attending: Gynecology | Admitting: Gynecology

## 2019-09-12 DIAGNOSIS — Z1231 Encounter for screening mammogram for malignant neoplasm of breast: Secondary | ICD-10-CM

## 2019-09-15 ENCOUNTER — Other Ambulatory Visit: Payer: Self-pay | Admitting: Gynecology

## 2019-09-15 DIAGNOSIS — R928 Other abnormal and inconclusive findings on diagnostic imaging of breast: Secondary | ICD-10-CM

## 2019-09-23 NOTE — Progress Notes (Signed)
  Subjective:  Patient ID: Laura Boone, female    DOB: 09-29-1955,  MRN: [id number]  No chief complaint on file.   64 y.o. female presents with the above complaint. Reports infected ingrown nail to the left great toe with warmth erythema and pain with pus drainage Objective:  Physical Exam: warm, good capillary refill, no trophic changes or ulcerative lesions, normal DP and PT pulses and normal sensory exam.  Painful ingrowing nail at left great toe local warmth noted, local erythema noted and purulent drainage noted  Assessment:   1. Paronychia of great toe, left      Plan:  Patient was evaluated and treated and all questions answered.  Paronychia, left -Patient elects to proceed with ingrown toenail removal today -Ingrown nail excised. See procedure note. -Educated on post-procedure care including soaking. Written instructions provided. -Rx doxcycline  Procedure: Incision and drainage of paronychia Location: Left 1st toe  Anesthesia: Lidocaine 1% plain; 1.52mL and Marcaine 0.5% plain; 1.46mL, digital block. Skin Prep: Alcohol. Dressing: Silvadene; telfa; dry, sterile, compression dressing. Technique: Following skin prep, the toe was exsanguinated and a tourniquet was secured at the base of the toe. The affected nail border was freed, split with a nail splitter, and excised.  The lateral border was incised with a 15 blade to evacuate purulent drainage.  The area was irrigated. The tourniquet was then removed and sterile dressing applied. Disposition: Patient tolerated procedure well. Patient to return in 2 weeks for follow-up.   Return in about 2 weeks (around 07/14/2019) for Nail Check.   MDM

## 2019-09-27 ENCOUNTER — Other Ambulatory Visit: Payer: Self-pay | Admitting: Gynecology

## 2019-09-27 ENCOUNTER — Other Ambulatory Visit: Payer: Self-pay

## 2019-09-27 ENCOUNTER — Ambulatory Visit
Admission: RE | Admit: 2019-09-27 | Discharge: 2019-09-27 | Disposition: A | Discharge status: Home / Self Care | Payer: BC Managed Care – PPO | Source: Ambulatory Visit | Attending: Gynecology | Admitting: Gynecology

## 2019-09-27 DIAGNOSIS — S2002XA Contusion of left breast, initial encounter: Secondary | ICD-10-CM | POA: Diagnosis not present

## 2019-09-27 DIAGNOSIS — N6489 Other specified disorders of breast: Secondary | ICD-10-CM | POA: Diagnosis not present

## 2019-09-27 DIAGNOSIS — N641 Fat necrosis of breast: Secondary | ICD-10-CM

## 2019-09-27 DIAGNOSIS — R928 Other abnormal and inconclusive findings on diagnostic imaging of breast: Secondary | ICD-10-CM | POA: Diagnosis not present

## 2019-09-28 DIAGNOSIS — Z130 Encounter for screening for diseases of the blood and blood-forming organs and certain disorders involving the immune mechanism: Secondary | ICD-10-CM | POA: Diagnosis not present

## 2019-09-28 DIAGNOSIS — Z780 Asymptomatic menopausal state: Secondary | ICD-10-CM | POA: Diagnosis not present

## 2019-09-28 DIAGNOSIS — Z124 Encounter for screening for malignant neoplasm of cervix: Secondary | ICD-10-CM | POA: Diagnosis not present

## 2019-09-28 DIAGNOSIS — Z6836 Body mass index (BMI) 36.0-36.9, adult: Secondary | ICD-10-CM | POA: Diagnosis not present

## 2019-09-28 DIAGNOSIS — Z01419 Encounter for gynecological examination (general) (routine) without abnormal findings: Secondary | ICD-10-CM | POA: Diagnosis not present

## 2019-09-29 DIAGNOSIS — Z1151 Encounter for screening for human papillomavirus (HPV): Secondary | ICD-10-CM | POA: Diagnosis not present

## 2019-09-29 DIAGNOSIS — Z124 Encounter for screening for malignant neoplasm of cervix: Secondary | ICD-10-CM | POA: Diagnosis not present

## 2019-10-31 DIAGNOSIS — L308 Other specified dermatitis: Secondary | ICD-10-CM | POA: Diagnosis not present

## 2019-10-31 DIAGNOSIS — B0089 Other herpesviral infection: Secondary | ICD-10-CM | POA: Diagnosis not present

## 2019-10-31 DIAGNOSIS — Z85828 Personal history of other malignant neoplasm of skin: Secondary | ICD-10-CM | POA: Diagnosis not present

## 2019-10-31 DIAGNOSIS — L720 Epidermal cyst: Secondary | ICD-10-CM | POA: Diagnosis not present

## 2020-03-28 ENCOUNTER — Ambulatory Visit
Admission: RE | Admit: 2020-03-28 | Discharge: 2020-03-28 | Disposition: A | Discharge status: Home / Self Care | Payer: BC Managed Care – PPO | Source: Ambulatory Visit | Attending: Endocrinology | Admitting: Endocrinology

## 2020-03-28 DIAGNOSIS — E041 Nontoxic single thyroid nodule: Secondary | ICD-10-CM

## 2020-04-01 ENCOUNTER — Other Ambulatory Visit: Payer: Self-pay

## 2020-04-01 ENCOUNTER — Other Ambulatory Visit: Payer: Self-pay | Admitting: Gynecology

## 2020-04-01 ENCOUNTER — Ambulatory Visit
Admission: RE | Admit: 2020-04-01 | Discharge: 2020-04-01 | Disposition: A | Discharge status: Home / Self Care | Payer: BC Managed Care – PPO | Source: Ambulatory Visit | Attending: Gynecology | Admitting: Gynecology

## 2020-04-01 DIAGNOSIS — N641 Fat necrosis of breast: Secondary | ICD-10-CM | POA: Diagnosis not present

## 2020-04-16 DIAGNOSIS — Z1331 Encounter for screening for depression: Secondary | ICD-10-CM | POA: Diagnosis not present

## 2020-04-16 DIAGNOSIS — E1169 Type 2 diabetes mellitus with other specified complication: Secondary | ICD-10-CM | POA: Diagnosis not present

## 2020-04-25 DIAGNOSIS — E1165 Type 2 diabetes mellitus with hyperglycemia: Secondary | ICD-10-CM | POA: Diagnosis not present

## 2020-04-25 DIAGNOSIS — E7800 Pure hypercholesterolemia, unspecified: Secondary | ICD-10-CM | POA: Diagnosis not present

## 2020-04-25 DIAGNOSIS — E041 Nontoxic single thyroid nodule: Secondary | ICD-10-CM | POA: Diagnosis not present

## 2020-04-25 DIAGNOSIS — A000 Cholera due to Vibrio cholerae 01, biovar cholerae: Secondary | ICD-10-CM | POA: Diagnosis not present

## 2020-05-02 DIAGNOSIS — E041 Nontoxic single thyroid nodule: Secondary | ICD-10-CM | POA: Diagnosis not present

## 2020-05-02 DIAGNOSIS — E7800 Pure hypercholesterolemia, unspecified: Secondary | ICD-10-CM | POA: Diagnosis not present

## 2020-05-02 DIAGNOSIS — E1165 Type 2 diabetes mellitus with hyperglycemia: Secondary | ICD-10-CM | POA: Diagnosis not present

## 2020-07-29 DIAGNOSIS — S6991XA Unspecified injury of right wrist, hand and finger(s), initial encounter: Secondary | ICD-10-CM | POA: Diagnosis not present

## 2020-07-29 DIAGNOSIS — M25531 Pain in right wrist: Secondary | ICD-10-CM | POA: Diagnosis not present

## 2020-07-29 DIAGNOSIS — S6291XA Unspecified fracture of right wrist and hand, initial encounter for closed fracture: Secondary | ICD-10-CM | POA: Diagnosis not present

## 2020-08-01 DIAGNOSIS — S62114A Nondisplaced fracture of triquetrum [cuneiform] bone, right wrist, initial encounter for closed fracture: Secondary | ICD-10-CM | POA: Diagnosis not present

## 2020-08-01 DIAGNOSIS — S52571A Other intraarticular fracture of lower end of right radius, initial encounter for closed fracture: Secondary | ICD-10-CM | POA: Diagnosis not present

## 2020-08-22 DIAGNOSIS — S52571A Other intraarticular fracture of lower end of right radius, initial encounter for closed fracture: Secondary | ICD-10-CM | POA: Diagnosis not present

## 2020-08-22 DIAGNOSIS — S62114A Nondisplaced fracture of triquetrum [cuneiform] bone, right wrist, initial encounter for closed fracture: Secondary | ICD-10-CM | POA: Diagnosis not present

## 2020-09-16 ENCOUNTER — Other Ambulatory Visit: Payer: Self-pay | Admitting: Gynecology

## 2020-09-16 ENCOUNTER — Other Ambulatory Visit: Payer: Self-pay

## 2020-09-16 ENCOUNTER — Ambulatory Visit
Admission: RE | Admit: 2020-09-16 | Discharge: 2020-09-16 | Disposition: A | Discharge status: Home / Self Care | Payer: BC Managed Care – PPO | Source: Ambulatory Visit | Attending: Gynecology | Admitting: Gynecology

## 2020-09-16 DIAGNOSIS — N641 Fat necrosis of breast: Secondary | ICD-10-CM

## 2020-09-16 DIAGNOSIS — R922 Inconclusive mammogram: Secondary | ICD-10-CM | POA: Diagnosis not present

## 2020-09-19 DIAGNOSIS — S62114A Nondisplaced fracture of triquetrum [cuneiform] bone, right wrist, initial encounter for closed fracture: Secondary | ICD-10-CM | POA: Diagnosis not present

## 2020-09-19 DIAGNOSIS — S52571A Other intraarticular fracture of lower end of right radius, initial encounter for closed fracture: Secondary | ICD-10-CM | POA: Diagnosis not present

## 2020-10-16 DIAGNOSIS — E785 Hyperlipidemia, unspecified: Secondary | ICD-10-CM | POA: Diagnosis not present

## 2020-10-16 DIAGNOSIS — E041 Nontoxic single thyroid nodule: Secondary | ICD-10-CM | POA: Diagnosis not present

## 2020-10-22 DIAGNOSIS — Z23 Encounter for immunization: Secondary | ICD-10-CM | POA: Diagnosis not present

## 2020-10-22 DIAGNOSIS — Z1389 Encounter for screening for other disorder: Secondary | ICD-10-CM | POA: Diagnosis not present

## 2020-10-22 DIAGNOSIS — Z0000 Encounter for general adult medical examination without abnormal findings: Secondary | ICD-10-CM | POA: Diagnosis not present

## 2020-10-22 DIAGNOSIS — Z1331 Encounter for screening for depression: Secondary | ICD-10-CM | POA: Diagnosis not present

## 2020-10-22 DIAGNOSIS — E1169 Type 2 diabetes mellitus with other specified complication: Secondary | ICD-10-CM | POA: Diagnosis not present

## 2020-11-19 DIAGNOSIS — S62114A Nondisplaced fracture of triquetrum [cuneiform] bone, right wrist, initial encounter for closed fracture: Secondary | ICD-10-CM | POA: Diagnosis not present

## 2020-11-19 DIAGNOSIS — S52571A Other intraarticular fracture of lower end of right radius, initial encounter for closed fracture: Secondary | ICD-10-CM | POA: Diagnosis not present

## 2021-01-21 DIAGNOSIS — Z01419 Encounter for gynecological examination (general) (routine) without abnormal findings: Secondary | ICD-10-CM | POA: Diagnosis not present

## 2021-01-21 DIAGNOSIS — Z6833 Body mass index (BMI) 33.0-33.9, adult: Secondary | ICD-10-CM | POA: Diagnosis not present

## 2021-01-21 DIAGNOSIS — Z780 Asymptomatic menopausal state: Secondary | ICD-10-CM | POA: Diagnosis not present

## 2021-01-21 DIAGNOSIS — Z130 Encounter for screening for diseases of the blood and blood-forming organs and certain disorders involving the immune mechanism: Secondary | ICD-10-CM | POA: Diagnosis not present

## 2021-01-30 DIAGNOSIS — E785 Hyperlipidemia, unspecified: Secondary | ICD-10-CM | POA: Diagnosis not present

## 2021-01-30 DIAGNOSIS — E1169 Type 2 diabetes mellitus with other specified complication: Secondary | ICD-10-CM | POA: Diagnosis not present

## 2021-01-30 DIAGNOSIS — E669 Obesity, unspecified: Secondary | ICD-10-CM | POA: Diagnosis not present

## 2021-03-19 ENCOUNTER — Other Ambulatory Visit: Payer: BC Managed Care – PPO

## 2021-03-23 DIAGNOSIS — E119 Type 2 diabetes mellitus without complications: Secondary | ICD-10-CM | POA: Diagnosis not present

## 2021-04-01 ENCOUNTER — Ambulatory Visit
Admission: RE | Admit: 2021-04-01 | Discharge: 2021-04-01 | Disposition: A | Discharge status: Home / Self Care | Payer: BC Managed Care – PPO | Source: Ambulatory Visit | Attending: Gynecology | Admitting: Gynecology

## 2021-04-01 ENCOUNTER — Other Ambulatory Visit: Payer: Self-pay | Admitting: Gynecology

## 2021-04-01 ENCOUNTER — Ambulatory Visit: Payer: BC Managed Care – PPO

## 2021-04-01 DIAGNOSIS — N641 Fat necrosis of breast: Secondary | ICD-10-CM

## 2021-04-01 DIAGNOSIS — R922 Inconclusive mammogram: Secondary | ICD-10-CM | POA: Diagnosis not present

## 2021-04-23 DIAGNOSIS — E119 Type 2 diabetes mellitus without complications: Secondary | ICD-10-CM | POA: Diagnosis not present

## 2021-04-28 DIAGNOSIS — E7801 Familial hypercholesterolemia: Secondary | ICD-10-CM | POA: Diagnosis not present

## 2021-04-28 DIAGNOSIS — E041 Nontoxic single thyroid nodule: Secondary | ICD-10-CM | POA: Diagnosis not present

## 2021-04-28 DIAGNOSIS — E1165 Type 2 diabetes mellitus with hyperglycemia: Secondary | ICD-10-CM | POA: Diagnosis not present

## 2021-05-02 DIAGNOSIS — I10 Essential (primary) hypertension: Secondary | ICD-10-CM | POA: Diagnosis not present

## 2021-05-02 DIAGNOSIS — E041 Nontoxic single thyroid nodule: Secondary | ICD-10-CM | POA: Diagnosis not present

## 2021-05-02 DIAGNOSIS — E7800 Pure hypercholesterolemia, unspecified: Secondary | ICD-10-CM | POA: Diagnosis not present

## 2021-05-02 DIAGNOSIS — E1165 Type 2 diabetes mellitus with hyperglycemia: Secondary | ICD-10-CM | POA: Diagnosis not present

## 2021-05-21 DIAGNOSIS — E119 Type 2 diabetes mellitus without complications: Secondary | ICD-10-CM | POA: Diagnosis not present

## 2021-06-21 DIAGNOSIS — E119 Type 2 diabetes mellitus without complications: Secondary | ICD-10-CM | POA: Diagnosis not present

## 2021-07-21 DIAGNOSIS — E119 Type 2 diabetes mellitus without complications: Secondary | ICD-10-CM | POA: Diagnosis not present

## 2021-08-21 DIAGNOSIS — E119 Type 2 diabetes mellitus without complications: Secondary | ICD-10-CM | POA: Diagnosis not present

## 2021-08-28 ENCOUNTER — Other Ambulatory Visit: Payer: Self-pay | Admitting: Gynecology

## 2021-08-28 DIAGNOSIS — N641 Fat necrosis of breast: Secondary | ICD-10-CM

## 2021-09-20 DIAGNOSIS — E119 Type 2 diabetes mellitus without complications: Secondary | ICD-10-CM | POA: Diagnosis not present

## 2021-09-22 IMAGING — MG DIGITAL SCREENING BILAT W/ TOMO W/ CAD
8 series · 9 of 24 positions shown · non-contrast
Comparison: Prior films

CLINICAL DATA: Screening.

EXAM:
DIGITAL SCREENING BILATERAL MAMMOGRAM WITH TOMO AND CAD

[L MLO synth-2D]
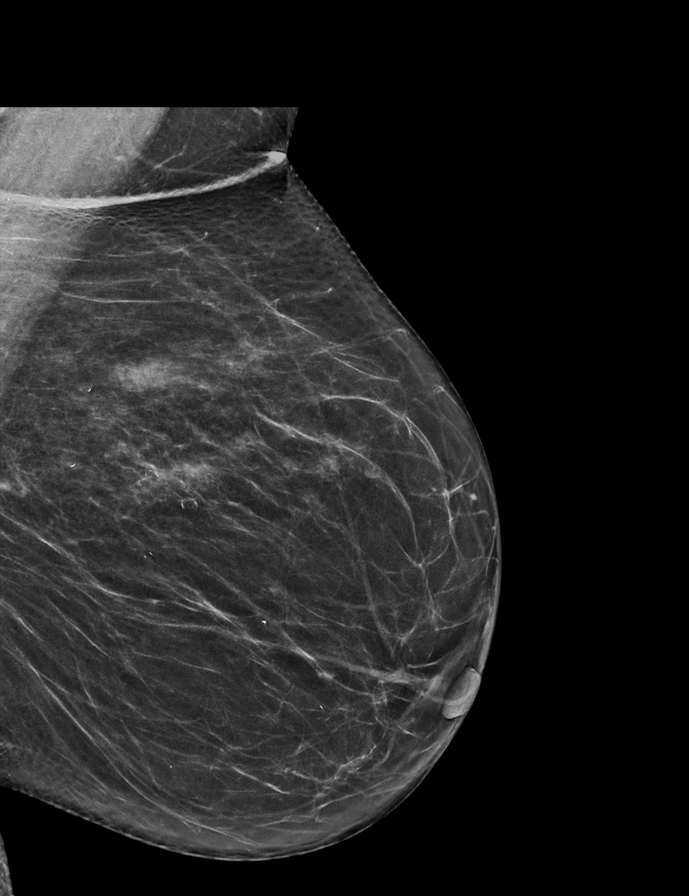

[R MLO synth-2D]
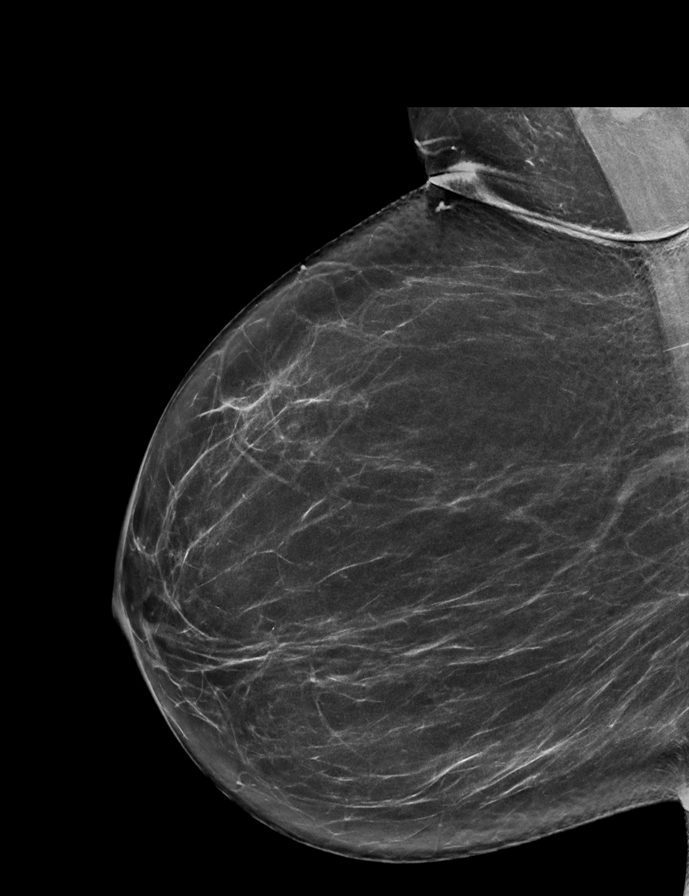

[R CC synth-2D]
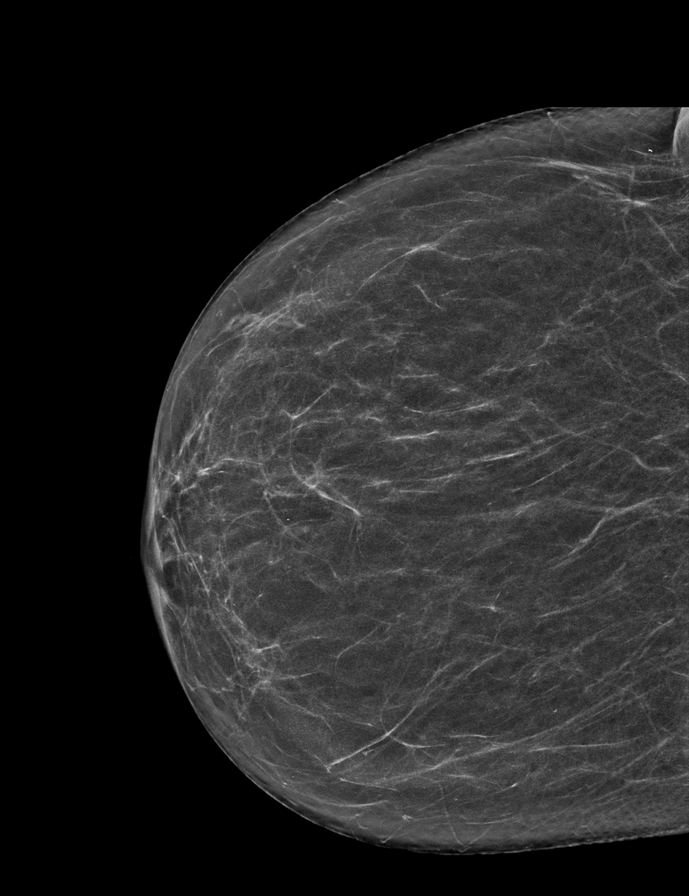

[L CC synth-2D]
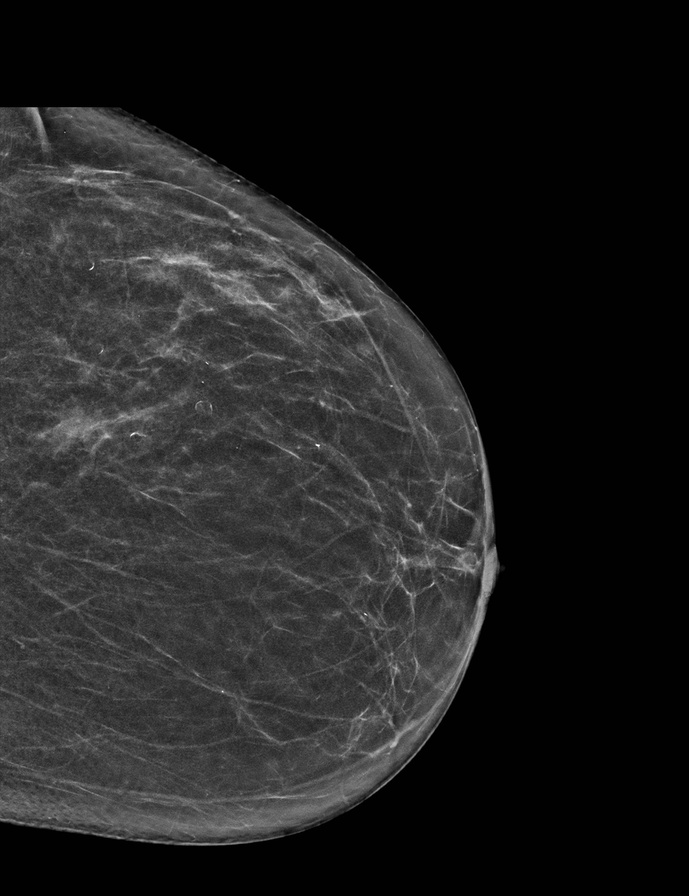

[L CC tomo · 2 of 57 frames shown]
[frame 19/57]
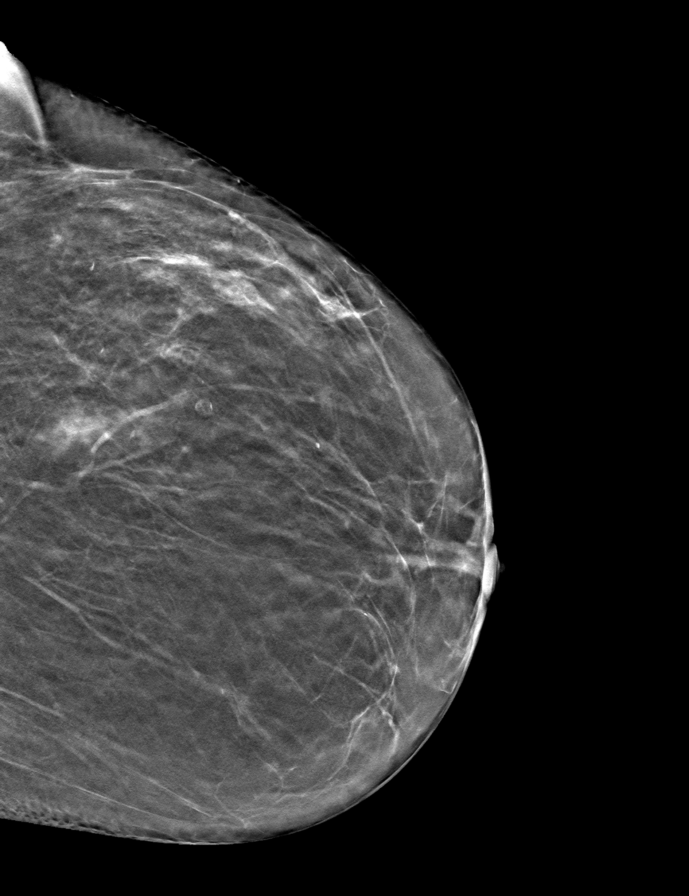
[frame 29/57]
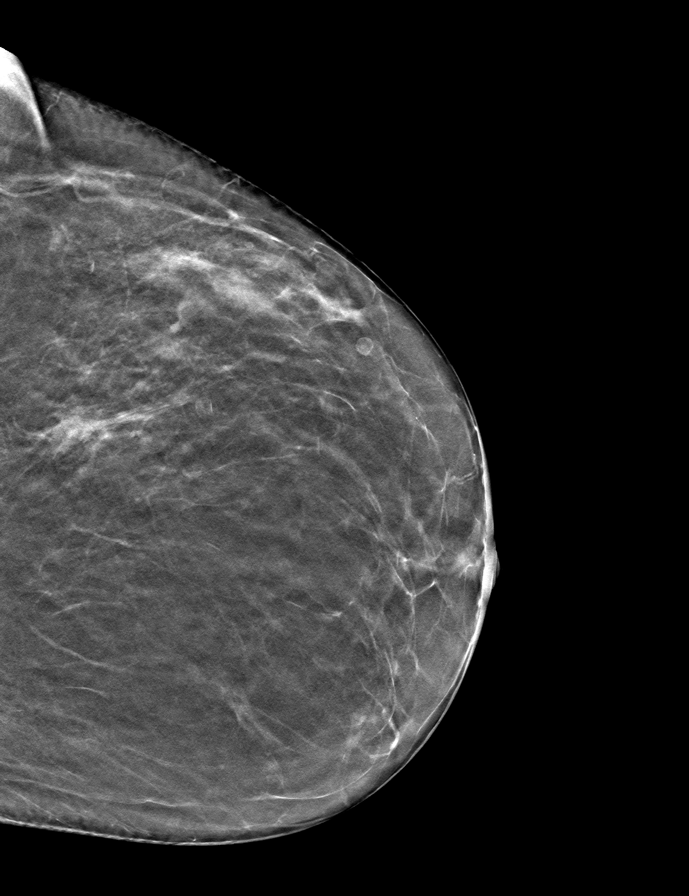

[R MLO tomo · tomo slice 35/68.0]
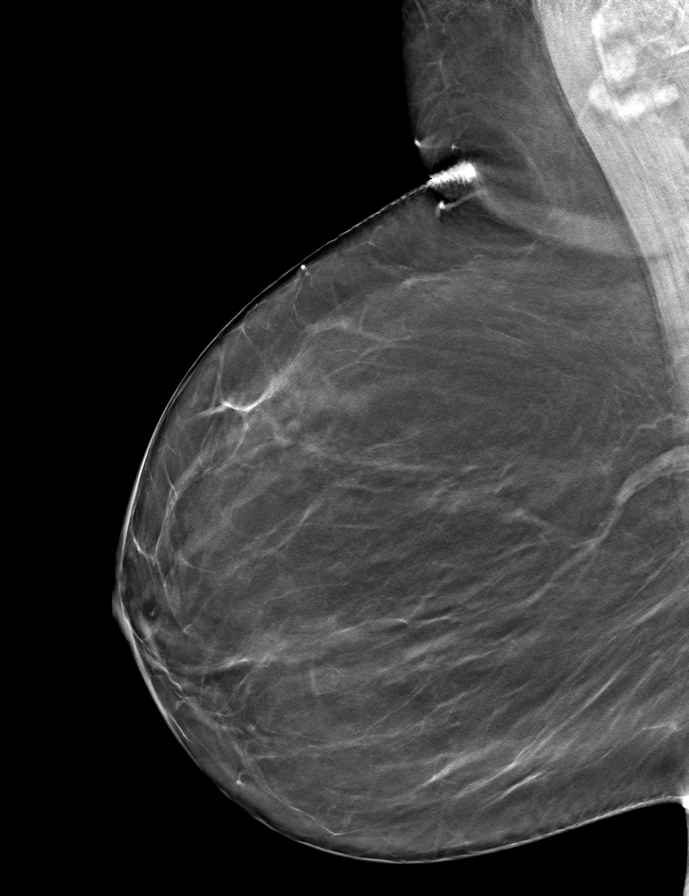

[R CC tomo · tomo slice 29/58.0]
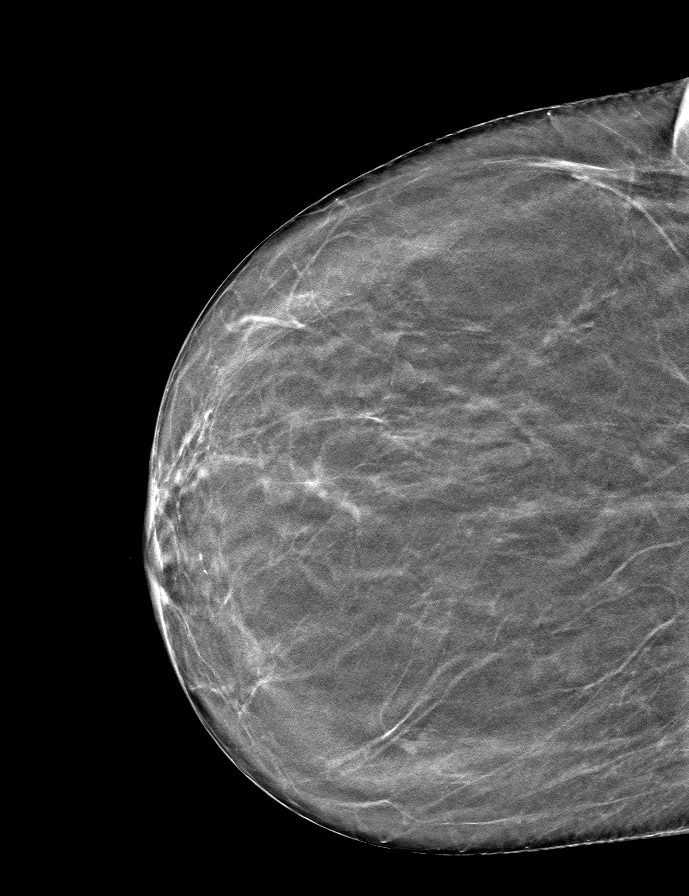

[L MLO tomo · tomo slice 33/64.0]
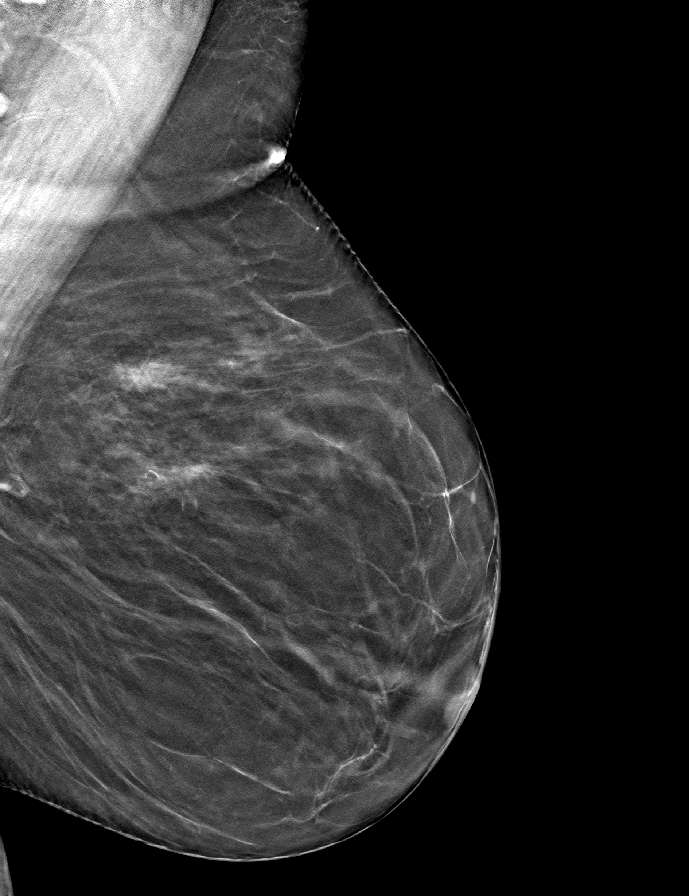

[9 of 24 positions shown; findings below may reference images not displayed]

ACR Breast Density Category b: There are scattered areas of
fibroglandular density.
FINDINGS: In the left breast, a possible asymmetry warrants further
evaluation. In the right breast, no findings suspicious for
malignancy. Images were processed with CAD.
IMPRESSION: Further evaluation is suggested for possible asymmetry in the left
breast.

RECOMMENDATION:
Diagnostic mammogram and possibly ultrasound of the left breast.
(Code:JL-[id number])

The patient will be contacted regarding the findings, and additional
imaging will be scheduled.

BI-RADS CATEGORY  0: Incomplete. Need additional imaging evaluation
and/or prior mammograms for comparison.

## 2021-10-14 ENCOUNTER — Encounter (HOSPITAL_COMMUNITY): Payer: BC Managed Care – PPO

## 2021-10-21 ENCOUNTER — Other Ambulatory Visit: Payer: Self-pay | Admitting: Gynecology

## 2021-10-21 ENCOUNTER — Ambulatory Visit
Admission: RE | Admit: 2021-10-21 | Discharge: 2021-10-21 | Disposition: A | Discharge status: Home / Self Care | Payer: BC Managed Care – PPO | Source: Ambulatory Visit | Attending: Gynecology | Admitting: Gynecology

## 2021-10-21 DIAGNOSIS — N641 Fat necrosis of breast: Secondary | ICD-10-CM

## 2021-10-21 DIAGNOSIS — R928 Other abnormal and inconclusive findings on diagnostic imaging of breast: Secondary | ICD-10-CM | POA: Diagnosis not present

## 2021-10-21 DIAGNOSIS — E119 Type 2 diabetes mellitus without complications: Secondary | ICD-10-CM | POA: Diagnosis not present

## 2021-10-23 DIAGNOSIS — E785 Hyperlipidemia, unspecified: Secondary | ICD-10-CM | POA: Diagnosis not present

## 2021-10-23 DIAGNOSIS — R739 Hyperglycemia, unspecified: Secondary | ICD-10-CM | POA: Diagnosis not present

## 2021-11-04 ENCOUNTER — Other Ambulatory Visit: Payer: Self-pay | Admitting: Internal Medicine

## 2021-11-04 DIAGNOSIS — Z87891 Personal history of nicotine dependence: Secondary | ICD-10-CM

## 2021-11-04 DIAGNOSIS — Z1331 Encounter for screening for depression: Secondary | ICD-10-CM | POA: Diagnosis not present

## 2021-11-04 DIAGNOSIS — E1169 Type 2 diabetes mellitus with other specified complication: Secondary | ICD-10-CM | POA: Diagnosis not present

## 2021-11-04 DIAGNOSIS — Z1339 Encounter for screening examination for other mental health and behavioral disorders: Secondary | ICD-10-CM | POA: Diagnosis not present

## 2021-11-04 DIAGNOSIS — Z0000 Encounter for general adult medical examination without abnormal findings: Secondary | ICD-10-CM | POA: Diagnosis not present

## 2021-11-05 DIAGNOSIS — E041 Nontoxic single thyroid nodule: Secondary | ICD-10-CM | POA: Diagnosis not present

## 2021-11-05 DIAGNOSIS — E1165 Type 2 diabetes mellitus with hyperglycemia: Secondary | ICD-10-CM | POA: Diagnosis not present

## 2021-11-05 DIAGNOSIS — E7800 Pure hypercholesterolemia, unspecified: Secondary | ICD-10-CM | POA: Diagnosis not present

## 2021-11-05 DIAGNOSIS — I10 Essential (primary) hypertension: Secondary | ICD-10-CM | POA: Diagnosis not present

## 2021-11-21 DIAGNOSIS — E119 Type 2 diabetes mellitus without complications: Secondary | ICD-10-CM | POA: Diagnosis not present

## 2021-12-21 DIAGNOSIS — E119 Type 2 diabetes mellitus without complications: Secondary | ICD-10-CM | POA: Diagnosis not present

## 2022-01-21 DIAGNOSIS — E119 Type 2 diabetes mellitus without complications: Secondary | ICD-10-CM | POA: Diagnosis not present

## 2022-02-20 DIAGNOSIS — E119 Type 2 diabetes mellitus without complications: Secondary | ICD-10-CM | POA: Diagnosis not present

## 2022-03-23 DIAGNOSIS — E119 Type 2 diabetes mellitus without complications: Secondary | ICD-10-CM | POA: Diagnosis not present

## 2022-04-23 DIAGNOSIS — E119 Type 2 diabetes mellitus without complications: Secondary | ICD-10-CM | POA: Diagnosis not present

## 2022-05-19 DIAGNOSIS — I10 Essential (primary) hypertension: Secondary | ICD-10-CM | POA: Diagnosis not present

## 2022-05-19 DIAGNOSIS — E041 Nontoxic single thyroid nodule: Secondary | ICD-10-CM | POA: Diagnosis not present

## 2022-05-19 DIAGNOSIS — E7800 Pure hypercholesterolemia, unspecified: Secondary | ICD-10-CM | POA: Diagnosis not present

## 2022-05-19 DIAGNOSIS — E1165 Type 2 diabetes mellitus with hyperglycemia: Secondary | ICD-10-CM | POA: Diagnosis not present

## 2022-05-22 DIAGNOSIS — E119 Type 2 diabetes mellitus without complications: Secondary | ICD-10-CM | POA: Diagnosis not present

## 2022-06-17 ENCOUNTER — Ambulatory Visit
Admission: RE | Admit: 2022-06-17 | Discharge: 2022-06-17 | Disposition: A | Discharge status: Home / Self Care | Payer: BC Managed Care – PPO | Source: Ambulatory Visit | Attending: Internal Medicine | Admitting: Internal Medicine

## 2022-06-17 DIAGNOSIS — Z87891 Personal history of nicotine dependence: Secondary | ICD-10-CM | POA: Diagnosis not present

## 2022-06-17 DIAGNOSIS — J929 Pleural plaque without asbestos: Secondary | ICD-10-CM | POA: Diagnosis not present

## 2022-06-17 DIAGNOSIS — J432 Centrilobular emphysema: Secondary | ICD-10-CM | POA: Diagnosis not present

## 2022-06-17 DIAGNOSIS — I2510 Atherosclerotic heart disease of native coronary artery without angina pectoris: Secondary | ICD-10-CM | POA: Diagnosis not present

## 2022-06-22 DIAGNOSIS — E119 Type 2 diabetes mellitus without complications: Secondary | ICD-10-CM | POA: Diagnosis not present

## 2022-07-22 DIAGNOSIS — E119 Type 2 diabetes mellitus without complications: Secondary | ICD-10-CM | POA: Diagnosis not present

## 2022-08-14 ENCOUNTER — Other Ambulatory Visit: Payer: Self-pay | Admitting: Internal Medicine

## 2022-08-14 DIAGNOSIS — E2839 Other primary ovarian failure: Secondary | ICD-10-CM

## 2022-08-22 DIAGNOSIS — E119 Type 2 diabetes mellitus without complications: Secondary | ICD-10-CM | POA: Diagnosis not present

## 2022-09-21 DIAGNOSIS — E119 Type 2 diabetes mellitus without complications: Secondary | ICD-10-CM | POA: Diagnosis not present

## 2022-10-22 DIAGNOSIS — E119 Type 2 diabetes mellitus without complications: Secondary | ICD-10-CM | POA: Diagnosis not present

## 2022-11-05 DIAGNOSIS — E785 Hyperlipidemia, unspecified: Secondary | ICD-10-CM | POA: Diagnosis not present

## 2022-11-05 DIAGNOSIS — E041 Nontoxic single thyroid nodule: Secondary | ICD-10-CM | POA: Diagnosis not present

## 2022-11-05 DIAGNOSIS — E1169 Type 2 diabetes mellitus with other specified complication: Secondary | ICD-10-CM | POA: Diagnosis not present

## 2022-11-12 DIAGNOSIS — Z1339 Encounter for screening examination for other mental health and behavioral disorders: Secondary | ICD-10-CM | POA: Diagnosis not present

## 2022-11-12 DIAGNOSIS — Z1159 Encounter for screening for other viral diseases: Secondary | ICD-10-CM | POA: Diagnosis not present

## 2022-11-12 DIAGNOSIS — Z0000 Encounter for general adult medical examination without abnormal findings: Secondary | ICD-10-CM | POA: Diagnosis not present

## 2022-11-12 DIAGNOSIS — Z23 Encounter for immunization: Secondary | ICD-10-CM | POA: Diagnosis not present

## 2022-11-12 DIAGNOSIS — E1169 Type 2 diabetes mellitus with other specified complication: Secondary | ICD-10-CM | POA: Diagnosis not present

## 2022-11-12 DIAGNOSIS — Z1331 Encounter for screening for depression: Secondary | ICD-10-CM | POA: Diagnosis not present

## 2022-11-22 DIAGNOSIS — E119 Type 2 diabetes mellitus without complications: Secondary | ICD-10-CM | POA: Diagnosis not present

## 2022-11-25 ENCOUNTER — Other Ambulatory Visit: Payer: Self-pay | Admitting: Internal Medicine

## 2022-11-25 DIAGNOSIS — R911 Solitary pulmonary nodule: Secondary | ICD-10-CM

## 2022-12-22 DIAGNOSIS — E119 Type 2 diabetes mellitus without complications: Secondary | ICD-10-CM | POA: Diagnosis not present

## 2023-01-22 DIAGNOSIS — E119 Type 2 diabetes mellitus without complications: Secondary | ICD-10-CM | POA: Diagnosis not present

## 2023-02-05 DIAGNOSIS — E1165 Type 2 diabetes mellitus with hyperglycemia: Secondary | ICD-10-CM | POA: Diagnosis not present

## 2023-02-05 DIAGNOSIS — E041 Nontoxic single thyroid nodule: Secondary | ICD-10-CM | POA: Diagnosis not present

## 2023-02-12 ENCOUNTER — Other Ambulatory Visit: Payer: Self-pay | Admitting: Gynecology

## 2023-02-12 DIAGNOSIS — E7800 Pure hypercholesterolemia, unspecified: Secondary | ICD-10-CM | POA: Diagnosis not present

## 2023-02-12 DIAGNOSIS — I10 Essential (primary) hypertension: Secondary | ICD-10-CM | POA: Diagnosis not present

## 2023-02-12 DIAGNOSIS — E1165 Type 2 diabetes mellitus with hyperglycemia: Secondary | ICD-10-CM | POA: Diagnosis not present

## 2023-02-12 DIAGNOSIS — Z0000 Encounter for general adult medical examination without abnormal findings: Secondary | ICD-10-CM

## 2023-02-12 DIAGNOSIS — E041 Nontoxic single thyroid nodule: Secondary | ICD-10-CM | POA: Diagnosis not present

## 2023-02-21 DIAGNOSIS — E119 Type 2 diabetes mellitus without complications: Secondary | ICD-10-CM | POA: Diagnosis not present

## 2023-03-15 ENCOUNTER — Ambulatory Visit
Admission: RE | Admit: 2023-03-15 | Discharge: 2023-03-15 | Disposition: A | Discharge status: Home / Self Care | Payer: BC Managed Care – PPO | Source: Ambulatory Visit | Attending: Gynecology | Admitting: Gynecology

## 2023-03-15 DIAGNOSIS — Z1231 Encounter for screening mammogram for malignant neoplasm of breast: Secondary | ICD-10-CM | POA: Diagnosis not present

## 2023-03-15 DIAGNOSIS — Z0000 Encounter for general adult medical examination without abnormal findings: Secondary | ICD-10-CM

## 2023-04-20 DIAGNOSIS — Z01419 Encounter for gynecological examination (general) (routine) without abnormal findings: Secondary | ICD-10-CM | POA: Diagnosis not present

## 2023-04-23 ENCOUNTER — Other Ambulatory Visit: Payer: Self-pay | Admitting: Internal Medicine

## 2023-04-23 DIAGNOSIS — R911 Solitary pulmonary nodule: Secondary | ICD-10-CM

## 2023-05-03 ENCOUNTER — Ambulatory Visit
Admission: RE | Admit: 2023-05-03 | Discharge: 2023-05-03 | Disposition: A | Discharge status: Home / Self Care | Payer: BC Managed Care – PPO | Source: Ambulatory Visit | Attending: Internal Medicine

## 2023-05-03 DIAGNOSIS — E2839 Other primary ovarian failure: Secondary | ICD-10-CM

## 2023-05-03 DIAGNOSIS — M8588 Other specified disorders of bone density and structure, other site: Secondary | ICD-10-CM | POA: Diagnosis not present

## 2023-05-03 DIAGNOSIS — N958 Other specified menopausal and perimenopausal disorders: Secondary | ICD-10-CM | POA: Diagnosis not present

## 2023-05-19 ENCOUNTER — Other Ambulatory Visit: Payer: BC Managed Care – PPO

## 2023-05-25 DIAGNOSIS — M810 Age-related osteoporosis without current pathological fracture: Secondary | ICD-10-CM | POA: Diagnosis not present

## 2023-06-03 ENCOUNTER — Ambulatory Visit
Admission: RE | Admit: 2023-06-03 | Discharge: 2023-06-03 | Disposition: A | Discharge status: Home / Self Care | Payer: BC Managed Care – PPO | Source: Ambulatory Visit | Attending: Internal Medicine | Admitting: Internal Medicine

## 2023-06-03 DIAGNOSIS — J439 Emphysema, unspecified: Secondary | ICD-10-CM | POA: Diagnosis not present

## 2023-06-03 DIAGNOSIS — Z87891 Personal history of nicotine dependence: Secondary | ICD-10-CM | POA: Diagnosis not present

## 2023-06-03 DIAGNOSIS — R911 Solitary pulmonary nodule: Secondary | ICD-10-CM

## 2023-06-03 DIAGNOSIS — I700 Atherosclerosis of aorta: Secondary | ICD-10-CM | POA: Diagnosis not present

## 2023-06-09 DIAGNOSIS — E119 Type 2 diabetes mellitus without complications: Secondary | ICD-10-CM | POA: Diagnosis not present

## 2023-06-09 DIAGNOSIS — H53143 Visual discomfort, bilateral: Secondary | ICD-10-CM | POA: Diagnosis not present

## 2023-06-09 DIAGNOSIS — D3130 Benign neoplasm of unspecified choroid: Secondary | ICD-10-CM | POA: Diagnosis not present

## 2023-06-14 DIAGNOSIS — E7800 Pure hypercholesterolemia, unspecified: Secondary | ICD-10-CM | POA: Diagnosis not present

## 2023-06-14 DIAGNOSIS — E1165 Type 2 diabetes mellitus with hyperglycemia: Secondary | ICD-10-CM | POA: Diagnosis not present

## 2023-06-21 ENCOUNTER — Other Ambulatory Visit: Payer: Self-pay | Admitting: Endocrinology

## 2023-06-21 DIAGNOSIS — E041 Nontoxic single thyroid nodule: Secondary | ICD-10-CM | POA: Diagnosis not present

## 2023-06-21 DIAGNOSIS — E7800 Pure hypercholesterolemia, unspecified: Secondary | ICD-10-CM | POA: Diagnosis not present

## 2023-06-21 DIAGNOSIS — E1165 Type 2 diabetes mellitus with hyperglycemia: Secondary | ICD-10-CM | POA: Diagnosis not present

## 2023-06-21 DIAGNOSIS — I10 Essential (primary) hypertension: Secondary | ICD-10-CM | POA: Diagnosis not present

## 2023-06-28 ENCOUNTER — Ambulatory Visit
Admission: RE | Admit: 2023-06-28 | Discharge: 2023-06-28 | Disposition: A | Discharge status: Home / Self Care | Source: Ambulatory Visit | Attending: Endocrinology | Admitting: Endocrinology

## 2023-06-28 DIAGNOSIS — E042 Nontoxic multinodular goiter: Secondary | ICD-10-CM | POA: Diagnosis not present

## 2023-06-28 DIAGNOSIS — E041 Nontoxic single thyroid nodule: Secondary | ICD-10-CM

## 2023-12-20 DIAGNOSIS — Z0000 Encounter for general adult medical examination without abnormal findings: Secondary | ICD-10-CM | POA: Diagnosis not present

## 2023-12-20 DIAGNOSIS — E785 Hyperlipidemia, unspecified: Secondary | ICD-10-CM | POA: Diagnosis not present

## 2023-12-20 DIAGNOSIS — Z1330 Encounter for screening examination for mental health and behavioral disorders, unspecified: Secondary | ICD-10-CM | POA: Diagnosis not present

## 2023-12-20 DIAGNOSIS — E119 Type 2 diabetes mellitus without complications: Secondary | ICD-10-CM | POA: Diagnosis not present

## 2023-12-20 DIAGNOSIS — M816 Localized osteoporosis [Lequesne]: Secondary | ICD-10-CM | POA: Diagnosis not present

## 2023-12-20 DIAGNOSIS — E1169 Type 2 diabetes mellitus with other specified complication: Secondary | ICD-10-CM | POA: Diagnosis not present

## 2023-12-20 DIAGNOSIS — E041 Nontoxic single thyroid nodule: Secondary | ICD-10-CM | POA: Diagnosis not present

## 2023-12-20 DIAGNOSIS — R911 Solitary pulmonary nodule: Secondary | ICD-10-CM | POA: Diagnosis not present

## 2023-12-28 DIAGNOSIS — I10 Essential (primary) hypertension: Secondary | ICD-10-CM | POA: Diagnosis not present

## 2023-12-28 DIAGNOSIS — E1165 Type 2 diabetes mellitus with hyperglycemia: Secondary | ICD-10-CM | POA: Diagnosis not present

## 2023-12-28 DIAGNOSIS — E041 Nontoxic single thyroid nodule: Secondary | ICD-10-CM | POA: Diagnosis not present

## 2023-12-28 DIAGNOSIS — E7800 Pure hypercholesterolemia, unspecified: Secondary | ICD-10-CM | POA: Diagnosis not present
